# Patient Record
Sex: Male | Born: 1990 | Marital: Married | Smoking: Never smoker
Health system: Southern US, Community
[De-identification: ages and names within clinical notes are randomized; demographics above are authoritative.]

---

## 2022-03-14 ENCOUNTER — Other Ambulatory Visit: Payer: Self-pay | Admitting: Physician Assistant

## 2022-03-14 ENCOUNTER — Ambulatory Visit
Admission: RE | Admit: 2022-03-14 | Discharge: 2022-03-14 | Disposition: A | Discharge status: Home / Self Care | Payer: Self-pay | Source: Ambulatory Visit | Attending: Physician Assistant | Admitting: Physician Assistant

## 2022-03-14 DIAGNOSIS — M7660 Achilles tendinitis, unspecified leg: Secondary | ICD-10-CM

## 2022-07-18 ENCOUNTER — Emergency Department (HOSPITAL_COMMUNITY)
Admission: EM | Admit: 2022-07-18 | Discharge: 2022-07-18 | Disposition: A | Discharge status: Home / Self Care | Payer: Managed Care, Other (non HMO) | Attending: Emergency Medicine | Admitting: Emergency Medicine

## 2022-07-18 ENCOUNTER — Other Ambulatory Visit: Payer: Self-pay

## 2022-07-18 ENCOUNTER — Encounter (HOSPITAL_COMMUNITY): Payer: Self-pay | Admitting: Emergency Medicine

## 2022-07-18 DIAGNOSIS — R21 Rash and other nonspecific skin eruption: Secondary | ICD-10-CM

## 2022-07-18 LAB — COMPREHENSIVE METABOLIC PANEL
ALT: 43 U/L (ref 0–44)
AST: 26 U/L (ref 15–41)
Albumin: 4.4 g/dL (ref 3.5–5.0)
Alkaline Phosphatase: 78 U/L (ref 38–126)
Anion gap: 11 (ref 5–15)
BUN: 9 mg/dL (ref 6–20)
CO2: 24 mmol/L (ref 22–32)
Calcium: 9.5 mg/dL (ref 8.9–10.3)
Chloride: 104 mmol/L (ref 98–111)
Creatinine, Ser: 0.83 mg/dL (ref 0.61–1.24)
GFR, Estimated: >60 mL/min (ref >60)
Glucose, Bld: 90 mg/dL (ref 70–99)
Potassium: 4.2 mmol/L (ref 3.5–5.1)
Sodium: 139 mmol/L (ref 135–145)
Total Bilirubin: 0.7 mg/dL (ref 0.3–1.2)
Total Protein: 7.6 g/dL (ref 6.5–8.1)

## 2022-07-18 LAB — CBC WITH DIFFERENTIAL/PLATELET
Abs Immature Granulocytes: 0.12 10*3/uL — ABNORMAL HIGH (ref 0.00–0.07)
Basophils Absolute: 0.1 10*3/uL (ref 0.0–0.1)
Basophils Relative: 0 %
Eosinophils Absolute: 0.3 10*3/uL (ref 0.0–0.5)
Eosinophils Relative: 2 %
HCT: 49.6 % (ref 39.0–52.0)
Hemoglobin: 16.6 g/dL (ref 13.0–17.0)
Immature Granulocytes: 1 %
Lymphocytes Relative: 27 %
Lymphs Abs: 4.6 10*3/uL — ABNORMAL HIGH (ref 0.7–4.0)
MCH: 28.3 pg (ref 26.0–34.0)
MCHC: 33.5 g/dL (ref 30.0–36.0)
MCV: 84.6 fL (ref 80.0–100.0)
Monocytes Absolute: 0.9 10*3/uL (ref 0.1–1.0)
Monocytes Relative: 5 %
Neutro Abs: 11 10*3/uL — ABNORMAL HIGH (ref 1.7–7.7)
Neutrophils Relative %: 65 %
Platelets: 297 10*3/uL (ref 150–400)
RBC: 5.86 MIL/uL — ABNORMAL HIGH (ref 4.22–5.81)
RDW: 13.2 % (ref 11.5–15.5)
WBC: 17 10*3/uL — ABNORMAL HIGH (ref 4.0–10.5)
nRBC: 0 % (ref 0.0–0.2)

## 2022-07-18 MED ORDER — HYDROXYZINE HCL 25 MG PO TABS
25.0000 mg | ORAL_TABLET | Freq: Once | ORAL | Status: AC
  Administered 2022-07-18: 25 mg via ORAL
  Filled 2022-07-18: qty 1

## 2022-07-18 MED ORDER — DOXYCYCLINE HYCLATE 100 MG PO CAPS: 100.0000 mg | ORAL_CAPSULE | Freq: Two times a day (BID) | ORAL | 0 refills | Status: AC

## 2022-07-18 MED ORDER — DOXYCYCLINE HYCLATE 100 MG PO TABS
100.0000 mg | ORAL_TABLET | Freq: Once | ORAL | Status: AC
  Administered 2022-07-18: 100 mg via ORAL
  Filled 2022-07-18: qty 1

## 2022-07-18 MED ORDER — HYDROXYZINE HCL 25 MG PO TABS: 25.0000 mg | ORAL_TABLET | Freq: Three times a day (TID) | ORAL | 0 refills | Status: AC | PRN

## 2022-07-18 NOTE — ED Triage Notes (Signed)
Patient reports chronic itchy rashes at scalp and face for several months unrelieved by prescription medications .

## 2022-07-18 NOTE — ED Provider Notes (Signed)
[facility] EMERGENCY DEPARTMENT Provider Note   CSN: [id number] Arrival date & time: 07/18/22  1833     History  Chief Complaint  Patient presents with   Scalp /Facial itching with rashes    Chronic since June    Randall Haynes is a 31 y.o. male.  Patient here with rash to his scalp.  He has been on ketoconazole shampoo and prednisone with minimal help.  Ongoing for the last several months.  No significant medical history.  Not sure if this started after using some hair gel a couple months ago at his wedding.  Denies any other allergens.  Denies any chest pain, shortness of breath.  He has had some rash on his chest wall and axilla as well.  Does have follow-up with dermatology in 2 weeks.  Has follow-up with the primary care doctor this week.  Denies any fevers or chills or weakness or numbness.  The history is provided by the patient.       Home Medications Prior to Admission medications   Medication Sig Start Date End Date Taking? Authorizing Provider  doxycycline (VIBRAMYCIN) 100 MG capsule Take 1 capsule (100 mg total) by mouth 2 (two) times daily for 10 days. 07/18/22 07/28/22 Yes Kiona Blume, DO  hydrOXYzine (ATARAX) 25 MG tablet Take 1 tablet (25 mg total) by mouth every 8 (eight) hours as needed for up to 20 doses for itching. 07/18/22  Yes Pamela Intrieri, DO      Allergies    Patient has no known allergies.    Review of Systems   Review of Systems  Physical Exam Updated Vital Signs BP (!) 136/106 (BP Location: Left Arm)   Pulse 65   Temp 98 F (36.7 C) (Oral)   Resp 16   SpO2 100%  Physical Exam Vitals and nursing note reviewed.  Constitutional:      General: He is not in acute distress.    Appearance: He is well-developed.  HENT:     Head: Normocephalic and atraumatic.  Eyes:     Conjunctiva/sclera: Conjunctivae normal.  Cardiovascular:     Rate and Rhythm: Normal rate and regular rhythm.     Heart sounds: No murmur heard. Pulmonary:      Effort: Pulmonary effort is normal. No respiratory distress.     Breath sounds: Normal breath sounds.  Abdominal:     Palpations: Abdomen is soft.     Tenderness: There is no abdominal tenderness.  Musculoskeletal:        General: No swelling.     Cervical back: Neck supple.  Skin:    General: Skin is warm and dry.     Capillary Refill: Capillary refill takes less than 2 seconds.     Comments: Dried scaly rash to the scalp, some areas of eczema on the chest and axilla  Neurological:     Mental Status: He is alert.  Psychiatric:        Mood and Affect: Mood normal.     ED Results / Procedures / Treatments   Labs (all labs ordered are listed, but only abnormal results are displayed) Labs Reviewed  CBC WITH DIFFERENTIAL/PLATELET - Abnormal; Notable for the following components:      Result Value   WBC 17.0 (*)    RBC 5.86 (*)    Neutro Abs 11.0 (*)    Lymphs Abs 4.6 (*)    Abs Immature Granulocytes 0.12 (*)    All other components within normal limits  COMPREHENSIVE  METABOLIC PANEL    EKG None  Radiology No results found.  Procedures Procedures    Medications Ordered in ED Medications  doxycycline (VIBRA-TABS) tablet 100 mg (has no administration in time range)  hydrOXYzine (ATARAX) tablet 25 mg (has no administration in time range)    ED Course/ Medical Decision Making/ A&P                           Medical Decision Making  Randall Haynes is here with scalp rash, rash on chest and armpits.  Overall normal vitals.  No fever.  He had CBC and BMP done in triage that was overall unremarkable except for mild leukocytosis.  This is likely from inflammatory process.  He has been using ketoconazole shampoo and prednisone with some help.  This appears to be some sort of contact dermatitis or eczema type process.  I am not concerned about any emergent rash otherwise.  There is no involvement of his mucosa.  I am not worried about Stevens-Johnson's or TENS.  No recent  antibiotics.  Conservatively will treat with oral doxycycline in case some areas of eczema type rash has mild infection around it.  Recommend continued use of ketoconazole shampoo.  He has follow-up with dermatology next week as well as primary care doctor.  Will prescribe Vistaril for his itchiness.  Understands return precautions.  Discharged in good condition.  This chart was dictated using voice recognition software.  Despite best efforts to proofread,  errors can occur which can change the documentation meaning.         Final Clinical Impression(s) / ED Diagnoses Final diagnoses:  Rash and nonspecific skin eruption    Rx / DC Orders ED Discharge Orders          Ordered    doxycycline (VIBRAMYCIN) 100 MG capsule  2 times daily        07/18/22 2307    hydrOXYzine (ATARAX) 25 MG tablet  Every 8 hours PRN        07/18/22 2307    Ambulatory referral to Dermatology        07/18/22 2307              Virgina Norfolk, DO 07/18/22 2307

## 2022-07-18 NOTE — ED Provider Triage Note (Signed)
Emergency Medicine Provider Triage Evaluation Note  Randall Haynes , a 31 y.o. male  was evaluated in triage.  Pt complains of rash.  Patient states that he has had a rash progressing over the past 3 months.  He has seen his primary care who has prescribed ketoconazole shampoo as well as cream and oral prednisone which have not helped his symptoms.  He notes rash increasingly spreading over the past 3 days as well as intermittent fever over the past week which is prompted his visit to the emergency department today.  Denies history of rashes similar.  Wife is in the room and states she has no rash.  Patient denies any use of drugs other than what is been prescribed for said rash.  Rash is described as itchy and "burning and pain once area starts to ooze.  Review of Systems  Positive: See above Negative:   Physical Exam  BP (!) 136/106 (BP Location: Left Arm)   Pulse 65   Temp 98 F (36.7 C) (Oral)   Resp 16   SpO2 100%  Gen:   Awake, no distress   Resp:  Normal effort  MSK:   Moves extremities without difficulty  Other:  Rash noted across patient's scalp, back, torso, abdomen, bilateral axilla..  Areas vary in appearance.  Area of the rash is indicated to look like an first appearance appear bullous and pus filled.  Medical Decision Making  Medically screening exam initiated at 8:01 PM.  Appropriate orders placed.  Nolin Grell was informed that the remainder of the evaluation will be completed by another provider, this initial triage assessment does not replace that evaluation, and the importance of remaining in the ED until their evaluation is complete.     Peter Garter, [place] 07/18/22 2003

## 2023-03-19 ENCOUNTER — Ambulatory Visit: Payer: Managed Care, Other (non HMO) | Admitting: Dermatology

## 2023-08-20 IMAGING — CR DG FOOT COMPLETE 3+V*R*
3 series · 3 of 3 positions shown · non-contrast
Comparison: None Available.

CLINICAL DATA: Right foot Achilles bursitis.

EXAM:
RIGHT FOOT COMPLETE - 3+ VIEW

[x foot ap right]
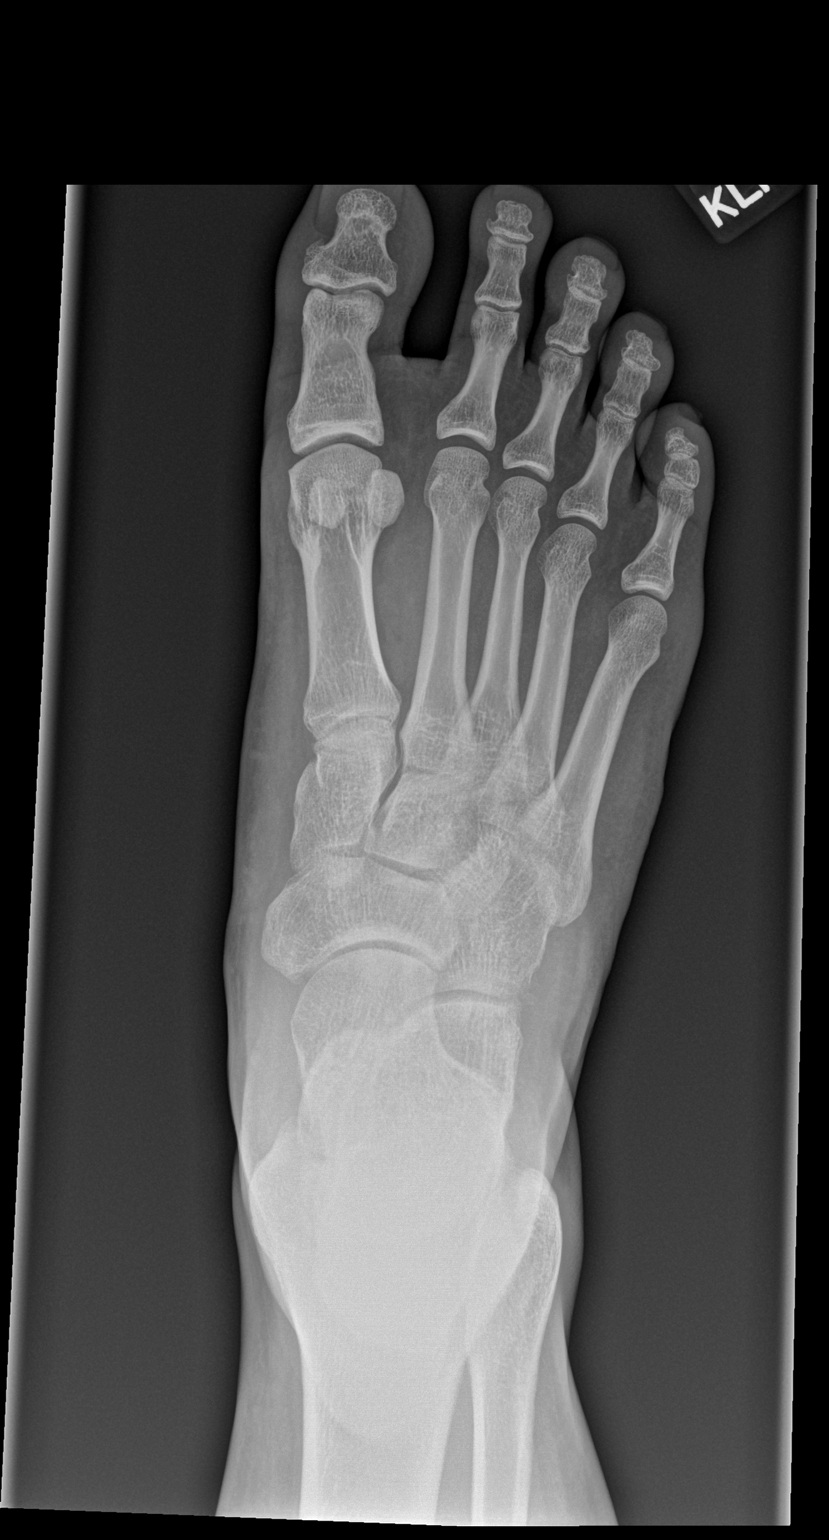

[x foot obl right]
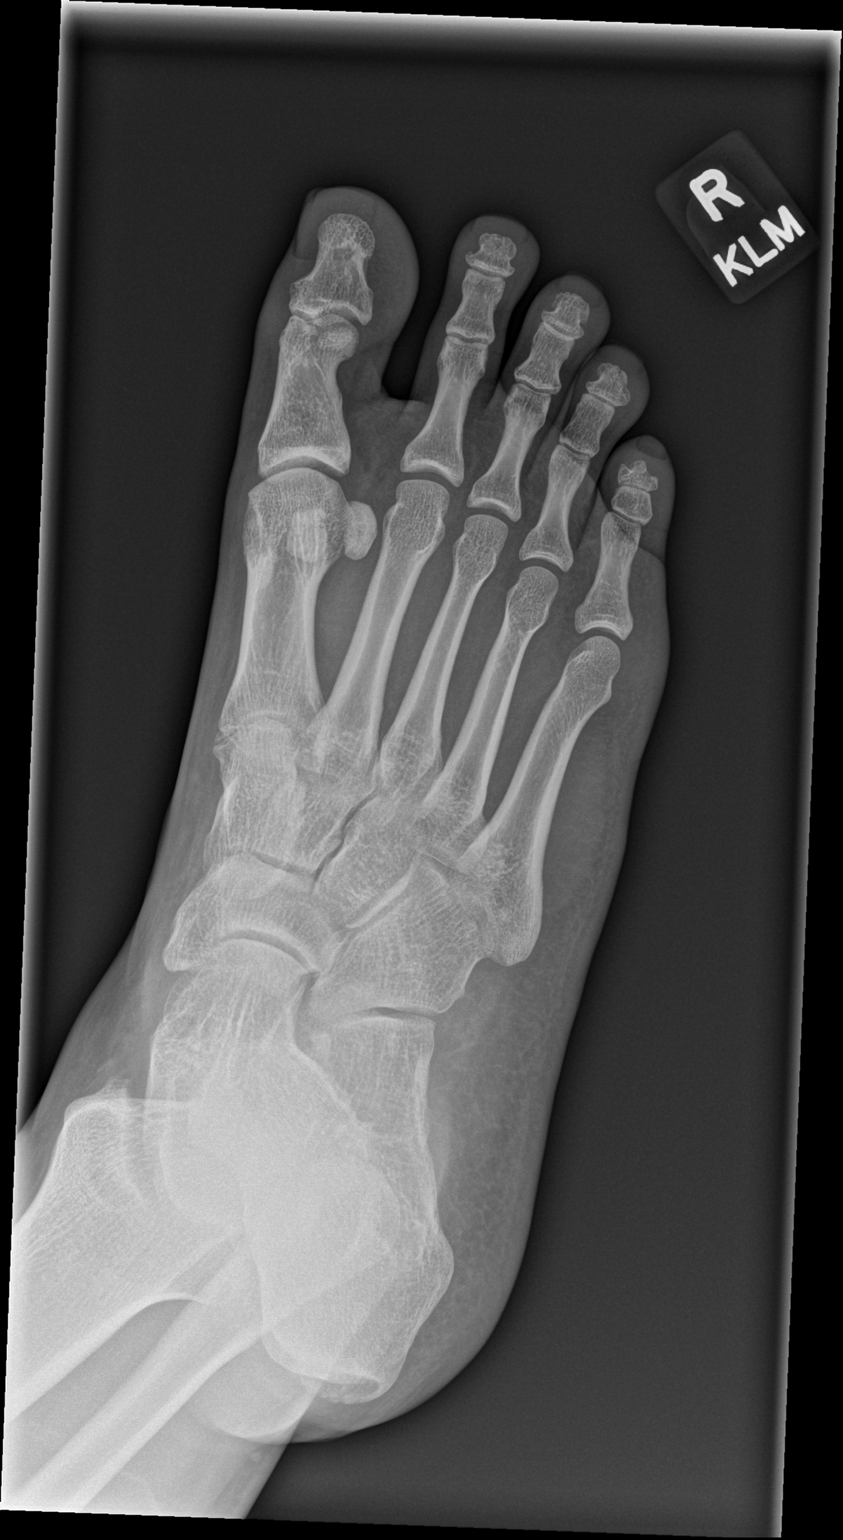

[x foot lat right]
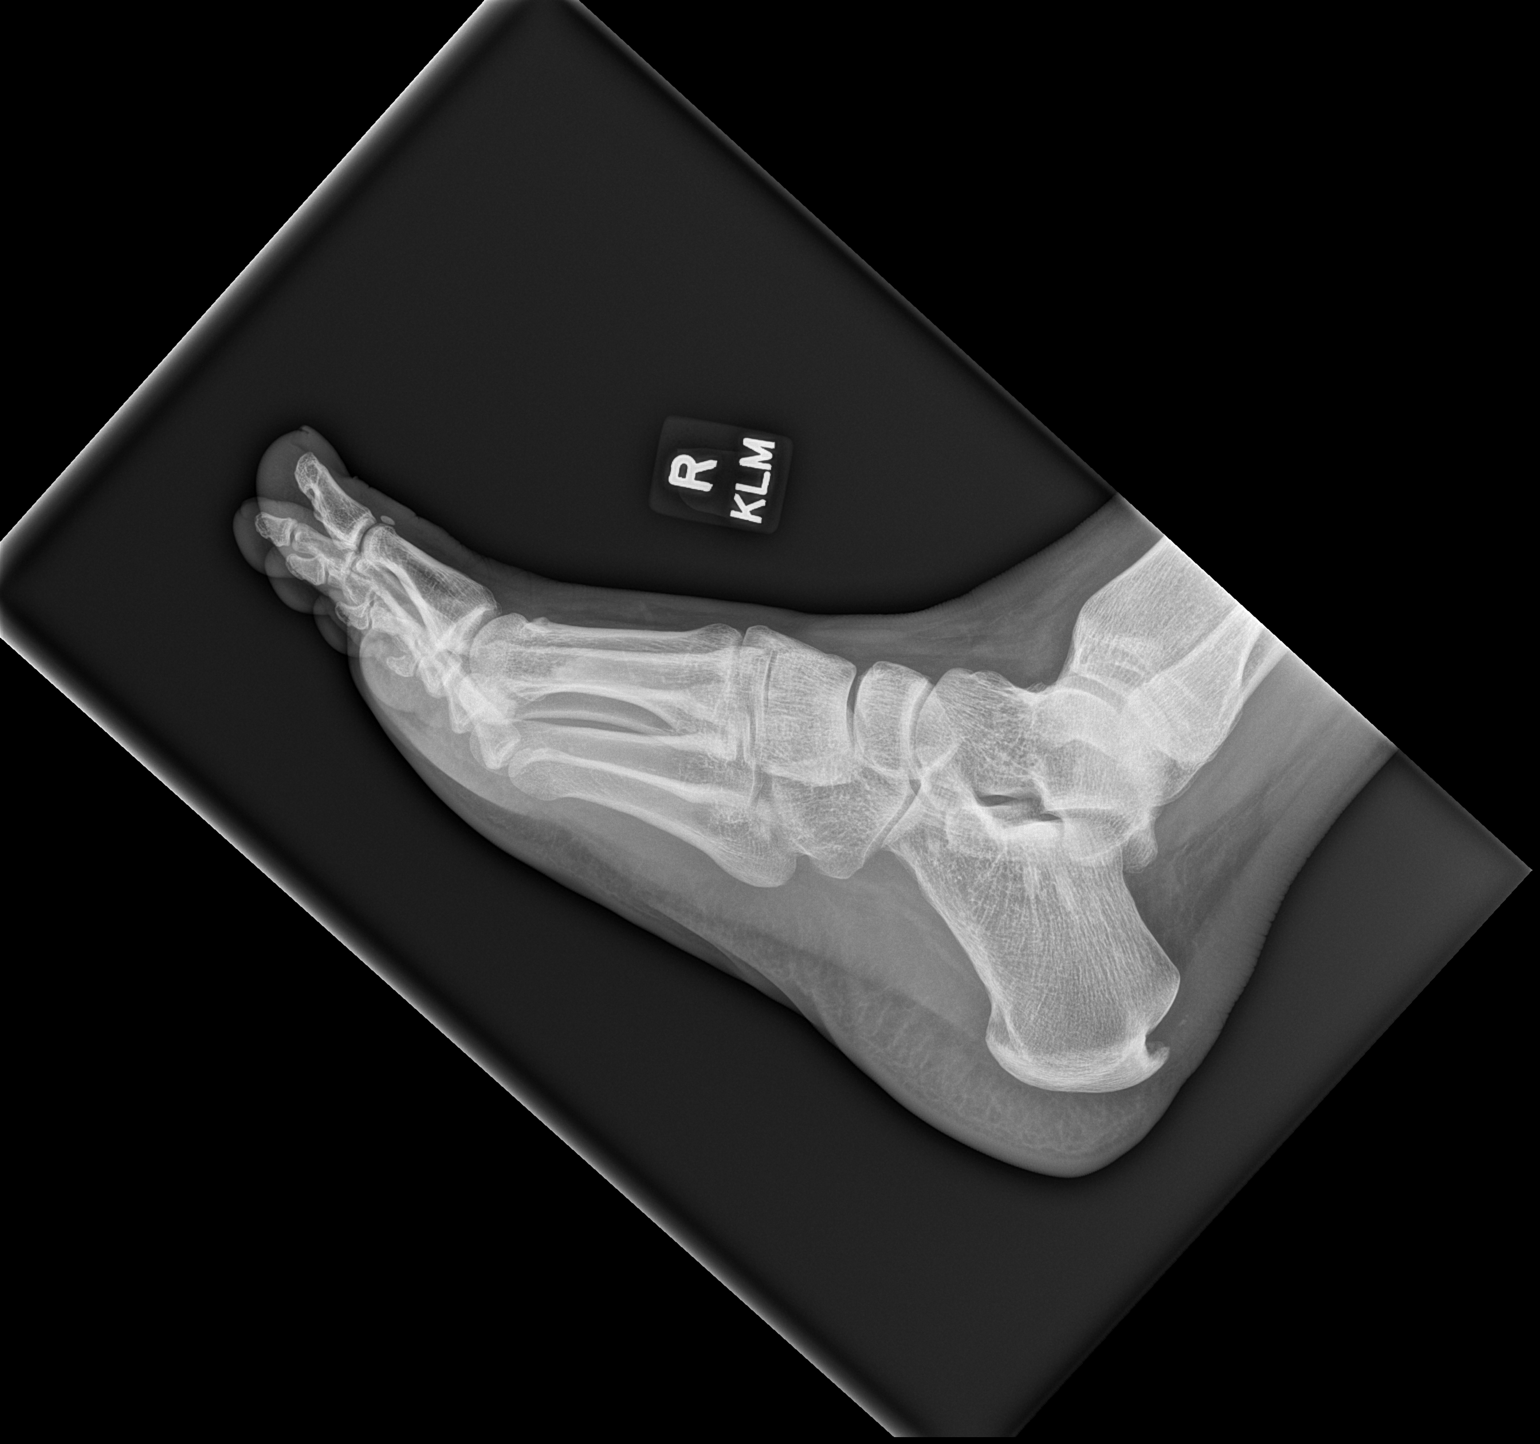

[3 of 3 positions shown; findings below may reference images not displayed]

FINDINGS: Enthesopathic changes are identified on the posterior calcaneus at
the Achilles insertion site. There is also soft tissue thickening
just superior to this enthesophyte. There is a small amount of
calcification is well, likely within the Achilles tendon. No other
abnormalities are identified.
IMPRESSION: 1. Enthesopathic changes at the Achilles insertion site on the
posterior calcaneus. Soft tissue thickening just superior to the
enthesophyte, suspicious for tendon thickening. There also may be
some calcification in the tendon just above the enthesophyte.
